# Patient Record
Sex: Female | Born: 1990 | Race: Black or African American | Hispanic: No | Marital: Single | Smoking: Never smoker
Health system: Southern US, Community
[De-identification: ages and names within clinical notes are randomized; demographics above are authoritative.]

## PROBLEM LIST (undated history)

## (undated) DIAGNOSIS — J45909 Unspecified asthma, uncomplicated: Secondary | ICD-10-CM

## (undated) HISTORY — PX: HERNIA REPAIR: SHX51

---
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
## 2009-06-09 ENCOUNTER — Emergency Department (HOSPITAL_COMMUNITY): Admission: EM | Admit: 2009-06-09 | Discharge: 2009-06-09 | Payer: Self-pay | Admitting: Family Medicine

## 2011-07-02 LAB — INFLUENZA A AND B ANTIGEN (CONVERTED LAB)
Inflenza A Ag: NEGATIVE
Influenza B Ag: NEGATIVE

## 2015-04-26 ENCOUNTER — Emergency Department (HOSPITAL_BASED_OUTPATIENT_CLINIC_OR_DEPARTMENT_OTHER)
Admission: EM | Admit: 2015-04-26 | Discharge: 2015-04-26 | Disposition: A | Discharge status: Home / Self Care | Payer: Self-pay | Attending: Emergency Medicine | Admitting: Emergency Medicine

## 2015-04-26 ENCOUNTER — Encounter (HOSPITAL_BASED_OUTPATIENT_CLINIC_OR_DEPARTMENT_OTHER): Payer: Self-pay

## 2015-04-26 ENCOUNTER — Emergency Department (HOSPITAL_BASED_OUTPATIENT_CLINIC_OR_DEPARTMENT_OTHER): Payer: Self-pay

## 2015-04-26 DIAGNOSIS — R200 Anesthesia of skin: Secondary | ICD-10-CM | POA: Insufficient documentation

## 2015-04-26 DIAGNOSIS — R079 Chest pain, unspecified: Secondary | ICD-10-CM

## 2015-04-26 DIAGNOSIS — R0789 Other chest pain: Secondary | ICD-10-CM | POA: Insufficient documentation

## 2015-04-26 DIAGNOSIS — D649 Anemia, unspecified: Secondary | ICD-10-CM | POA: Insufficient documentation

## 2015-04-26 DIAGNOSIS — Z880 Allergy status to penicillin: Secondary | ICD-10-CM | POA: Insufficient documentation

## 2015-04-26 DIAGNOSIS — R590 Localized enlarged lymph nodes: Secondary | ICD-10-CM | POA: Insufficient documentation

## 2015-04-26 DIAGNOSIS — J45909 Unspecified asthma, uncomplicated: Secondary | ICD-10-CM | POA: Insufficient documentation

## 2015-04-26 HISTORY — DX: Unspecified asthma, uncomplicated: J45.909

## 2015-04-26 LAB — BASIC METABOLIC PANEL
ANION GAP: 6 (ref 5–15)
BUN: 11 mg/dL (ref 6–20)
CHLORIDE: 102 mmol/L (ref 101–111)
CO2: 27 mmol/L (ref 22–32)
Calcium: 8.6 mg/dL — ABNORMAL LOW (ref 8.9–10.3)
Creatinine, Ser: 0.72 mg/dL (ref 0.44–1.00)
GFR calc Af Amer: >60 mL/min (ref >60)
GFR calc non Af Amer: >60 mL/min (ref >60)
Glucose, Bld: 101 mg/dL — ABNORMAL HIGH (ref 65–99)
POTASSIUM: 3.8 mmol/L (ref 3.5–5.1)
SODIUM: 135 mmol/L (ref 135–145)

## 2015-04-26 LAB — CBC WITH DIFFERENTIAL/PLATELET
BASOS ABS: 0 10*3/uL (ref 0.0–0.1)
Basophils Relative: 0 % (ref 0–1)
EOS ABS: 0.3 10*3/uL (ref 0.0–0.7)
EOS PCT: 2 % (ref 0–5)
HCT: 30.1 % — ABNORMAL LOW (ref 36.0–46.0)
Hemoglobin: 9.6 g/dL — ABNORMAL LOW (ref 12.0–15.0)
Lymphocytes Relative: 25 % (ref 12–46)
Lymphs Abs: 3.3 10*3/uL (ref 0.7–4.0)
MCH: 20.3 pg — ABNORMAL LOW (ref 26.0–34.0)
MCHC: 31.9 g/dL (ref 30.0–36.0)
MCV: 63.5 fL — AB (ref 78.0–100.0)
MONO ABS: 1.2 10*3/uL — AB (ref 0.1–1.0)
Monocytes Relative: 9 % (ref 3–12)
Neutro Abs: 8.3 10*3/uL — ABNORMAL HIGH (ref 1.7–7.7)
Neutrophils Relative %: 64 % (ref 43–77)
PLATELETS: 552 10*3/uL — AB (ref 150–400)
RBC: 4.74 MIL/uL (ref 3.87–5.11)
RDW: 19.1 % — AB (ref 11.5–15.5)
WBC: 13.1 10*3/uL — AB (ref 4.0–10.5)

## 2015-04-26 MED ORDER — FERROUS SULFATE 325 (65 FE) MG PO TABS: 325.0000 mg | ORAL_TABLET | Freq: Every day | ORAL | Status: AC

## 2015-04-26 NOTE — ED Notes (Signed)
Pt reports body aches x 5-6 weeks. Sts 3-4 days ago, she developed chest pain. Reports generalized chest pain. Also reports SHOB x 3-4 days

## 2015-04-26 NOTE — Discharge Instructions (Signed)
Anemia, Nonspecific °Anemia is a condition in which the concentration of red blood cells or hemoglobin in the blood is below normal. Hemoglobin is a substance in red blood cells that carries oxygen to the tissues of the body. Anemia results in not enough oxygen reaching these tissues.  °CAUSES  °Common causes of anemia include:  °· Excessive bleeding. Bleeding may be internal or external. This includes excessive bleeding from periods (in women) or from the intestine.   °· Poor nutrition.   °· Chronic kidney, thyroid, and liver disease.  °· Bone marrow disorders that decrease red blood cell production. °· Cancer and treatments for cancer. °· HIV, AIDS, and their treatments. °· Spleen problems that increase red blood cell destruction. °· Blood disorders. °· Excess destruction of red blood cells due to infection, medicines, and autoimmune disorders. °SIGNS AND SYMPTOMS  °· Minor weakness.   °· Dizziness.   °· Headache. °· Palpitations.   °· Shortness of breath, especially with exercise.   °· Paleness. °· Cold sensitivity. °· Indigestion. °· Nausea. °· Difficulty sleeping. °· Difficulty concentrating. °Symptoms may occur suddenly or they may develop slowly.  °DIAGNOSIS  °Additional blood tests are often needed. These help your health care provider determine the best treatment. Your health care provider will check your stool for blood and look for other causes of blood loss.  °TREATMENT  °Treatment varies depending on the cause of the anemia. Treatment can include:  °· Supplements of iron, vitamin B12, or folic acid.   °· Hormone medicines.   °· A blood transfusion. This may be needed if blood loss is severe.   °· Hospitalization. This may be needed if there is significant continual blood loss.   °· Dietary changes. °· Spleen removal. °HOME CARE INSTRUCTIONS °Keep all follow-up appointments. It often takes many weeks to correct anemia, and having your health care provider check on your condition and your response to  treatment is very important. °SEEK IMMEDIATE MEDICAL CARE IF:  °· You develop extreme weakness, shortness of breath, or chest pain.   °· You become dizzy or have trouble concentrating. °· You develop heavy vaginal bleeding.   °· You develop a rash.   °· You have bloody or black, tarry stools.   °· You faint.   °· You vomit up blood.   °· You vomit repeatedly.   °· You have abdominal pain. °· You have a fever or persistent symptoms for more than 2-3 days.   °· You have a fever and your symptoms suddenly get worse.   °· You are dehydrated.   °MAKE SURE YOU: °· Understand these instructions. °· Will watch your condition. °· Will get help right away if you are not doing well or get worse. °Document Released: 11/01/2004 Document Revised: 05/27/2013 Document Reviewed: 03/20/2013 °[employer]® Patient Information ©2015 [employer]. This information is not intended to replace advice given to you by your health care provider. Make sure you discuss any questions you have with your health care provider. ° °Chest Pain (Nonspecific) °It is often hard to give a specific diagnosis for the cause of chest pain. There is always a chance that your pain could be related to something serious, such as a heart attack or a blood clot in the lungs. You need to follow up with your health care provider for further evaluation. °CAUSES  °· Heartburn. °· Pneumonia or bronchitis. °· Anxiety or stress. °· Inflammation around your heart (pericarditis) or lung (pleuritis or pleurisy). °· A blood clot in the lung. °· A collapsed lung (pneumothorax). It can develop suddenly on its own (spontaneous pneumothorax) or from trauma to the chest. °·   Shingles infection (herpes zoster virus). °The chest wall is composed of bones, muscles, and cartilage. Any of these can be the source of the pain. °· The bones can be bruised by injury. °· The muscles or cartilage can be strained by coughing or overwork. °· The cartilage can be affected by inflammation and become  sore (costochondritis). °DIAGNOSIS  °Lab tests or other studies may be needed to find the cause of your pain. Your health care provider may have you take a test called an ambulatory electrocardiogram (ECG). An ECG records your heartbeat patterns over a 24-hour period. You may also have other tests, such as: °· Transthoracic echocardiogram (TTE). During echocardiography, sound waves are used to evaluate how blood flows through your heart. °· Transesophageal echocardiogram (TEE). °· Cardiac monitoring. This allows your health care provider to monitor your heart rate and rhythm in real time. °· Holter monitor. This is a portable device that records your heartbeat and can help diagnose heart arrhythmias. It allows your health care provider to track your heart activity for several days, if needed. °· Stress tests by exercise or by giving medicine that makes the heart beat faster. °TREATMENT  °· Treatment depends on what may be causing your chest pain. Treatment may include: °¨ Acid blockers for heartburn. °¨ Anti-inflammatory medicine. °¨ Pain medicine for inflammatory conditions. °¨ Antibiotics if an infection is present. °· You may be advised to change lifestyle habits. This includes stopping smoking and avoiding alcohol, caffeine, and chocolate. °· You may be advised to keep your head raised (elevated) when sleeping. This reduces the chance of acid going backward from your stomach into your esophagus. °Most of the time, nonspecific chest pain will improve within 2-3 days with rest and mild pain medicine.  °HOME CARE INSTRUCTIONS  °· If antibiotics were prescribed, take them as directed. Finish them even if you start to feel better. °· For the next few days, avoid physical activities that bring on chest pain. Continue physical activities as directed. °· Do not use any tobacco products, including cigarettes, chewing tobacco, or electronic cigarettes. °· Avoid drinking alcohol. °· Only take medicine as directed by your  health care provider. °· Follow your health care provider's suggestions for further testing if your chest pain does not go away. °· Keep any follow-up appointments you made. If you do not go to an appointment, you could develop lasting (chronic) problems with pain. If there is any problem keeping an appointment, call to reschedule. °SEEK MEDICAL CARE IF:  °· Your chest pain does not go away, even after treatment. °· You have a rash with blisters on your chest. °· You have a fever. °SEEK IMMEDIATE MEDICAL CARE IF:  °· You have increased chest pain or pain that spreads to your arm, neck, jaw, back, or abdomen. °· You have shortness of breath. °· You have an increasing cough, or you cough up blood. °· You have severe back or abdominal pain. °· You feel nauseous or vomit. °· You have severe weakness. °· You faint. °· You have chills. °This is an emergency. Do not wait to see if the pain will go away. Get medical help at once. Call your local emergency services (911 in [place] Do not drive yourself to the hospital. °MAKE SURE YOU:  °· Understand these instructions. °· Will watch your condition. °· Will get help right away if you are not doing well or get worse. °Document Released: 07/04/2005 Document Revised: 09/29/2013 Document Reviewed: 04/29/2008 °[employer]® Patient Information ©2015 [employer]. This   information is not intended to replace advice given to you by your health care provider. Make sure you discuss any questions you have with your health care provider. ° °

## 2015-04-26 NOTE — ED Notes (Signed)
Pt ambulating independently w/ steady gait on d/c in no acute distress, A&Ox4. D/c instructions reviewed w/ pt - pt denies any further questions or concerns at present. Rx given x1  

## 2015-04-26 NOTE — ED Provider Notes (Signed)
CSN: [id number]     Arrival date & time 04/26/15  1738 History  This chart was scribed for Chelsey CoreNathan Greycen Felter, MD by Chelsey Maxwell, ED Scribe. This patient was seen in room MH11/MH11 and patient care was started at 6:01 PM.    Chief Complaint  Patient presents with  . Chest Pain   The history is provided by the patient. No language interpreter was used.    HPI Comments: Chelsey Cheeselexandria Maxwell is a 24 y.o. female who presents to the Emergency Department complaining of constant, gradually worsening myalgias onset 6 weeks ago. States that she has had pain like this in the past but states that it usually goes away. Hurts most at morning and night. Reports associated generalized chest discomfort across her chest the past 3-4 days, with productive cough. States numbness from left shoulder radiating to arm as well. She denies sore throat, recent injury, heavy lifting, sick contacts or recent sickness. 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 Denies any chance that she could be pregnant or that she is a smoker.  Past Medical History  Diagnosis Date  . Asthma    Past Surgical History  Procedure Laterality Date  . Hernia repair     No family history on file. History  Substance Use Topics  . Smoking status: Never Smoker   . Smokeless tobacco: Not on file  . Alcohol Use: No   OB History    No data available     Review of Systems  HENT: Negative for sore throat.   Respiratory: Positive for cough.   Cardiovascular: Positive for chest pain.  Musculoskeletal: Positive for myalgias and arthralgias.  Neurological: Positive for numbness.  All other systems reviewed and are negative.  Allergies  Penicillins  Home Medications   Prior to Admission medications   Medication Sig Start Date End Date Taking? Authorizing Provider  ferrous sulfate 325 (65 FE) MG tablet Take 1 tablet (325 mg total) by mouth daily. 04/26/15   Chelsey CoreNathan Halil Rentz, MD   BP 103/72 mmHg   Pulse 102  Temp(Src) 98.4 F (36.9 C) (Oral)  Resp 22  Ht 5\' 6"  (1.676 m)  Wt 245 lb (111.131 kg)  BMI 39.56 kg/m2  SpO2 100%  LMP 04/17/2015  Physical Exam  Constitutional: She is oriented to person, place, and time. She appears well-developed and well-nourished. No distress.  HENT:  Head: Normocephalic and atraumatic.  Light anterior cervical adenopathy  Eyes: EOM are normal. Pupils are equal, round, and reactive to light.  Neck: Normal range of motion. Neck supple.  Cardiovascular: Normal rate and regular rhythm.   Pulmonary/Chest: Effort normal and breath sounds normal.  Anterior upper chest tenderness  Abdominal: Soft. There is no tenderness.  Musculoskeletal: Normal range of motion. She exhibits no tenderness.  No midline cervical tenderness, muscular tenderness  Neurological: She is alert and oriented to person, place, and time.  Sensation grossly intact over bilateral radial, medial and ulnar nerves but states that it is "delayed"  Skin: Skin is warm and dry.  Psychiatric: She has a normal mood and affect. Her behavior is normal.  Nursing note and vitals reviewed.   ED Course  Procedures (including critical care time) DIAGNOSTIC STUDIES: Oxygen Saturation is 100% on RA, normal by my interpretation.    COORDINATION OF CARE: 6:05 PM-Discussed treatment plan which includes chest x-ray and labs with pt at bedside and pt agreed to plan.   Labs Review Labs Reviewed  CBC WITH DIFFERENTIAL/PLATELET -  Abnormal; Notable for the following:    WBC 13.1 (*)    Hemoglobin 9.6 (*)    HCT 30.1 (*)    MCV 63.5 (*)    MCH 20.3 (*)    RDW 19.1 (*)    Platelets 552 (*)    Neutro Abs 8.3 (*)    Monocytes Absolute 1.2 (*)    All other components within normal limits  BASIC METABOLIC PANEL - Abnormal; Notable for the following:    Glucose, Bld 101 (*)    Calcium 8.6 (*)    All other components within normal limits    Imaging Review Dg Chest 2 View  04/26/2015   CLINICAL  DATA:  Upper chest pain/discomfort and LEFT arm numbness for 3 days, shortness of breath with exertion, cough, history asthma  EXAM: CHEST  2 VIEW  COMPARISON:  None  FINDINGS: Minimal enlargement of cardiac silhouette.  Mediastinal contours and pulmonary vascularity normal.  Lungs clear.  Minimal central peribronchial thickening.  No pleural effusion or pneumothorax.  Bones unremarkable.  IMPRESSION: Enlargement of cardiac silhouette.  Minimal bronchitic changes.   Electronically Signed   By: Ulyses Southward M.D.   On: 04/26/2015 18:50     EKG Interpretation   Date/Time:  Tuesday April 26 2015 17:55:18 EDT Ventricular Rate:  86 PR Interval:  160 QRS Duration: 88 QT Interval:  376 QTC Calculation: 449 R Axis:   73 Text Interpretation:  Normal sinus rhythm Normal ECG Confirmed by  Simya Tercero  MD, Harrold Donath [phone]) on 04/26/2015 6:01:04 PM      MDM   Final diagnoses:  Chest pain, unspecified chest pain type  Anemia, unspecified anemia type    Patient with chest pain and myalgias. No clear cause. Doubt ischemic cause. Lab work overall reassuring but does show anemia. Will follow-up with her PCP. She also has elevated white count and elevated platelets. I personally performed the services described in this documentation, which was scribed in my presence. The recorded information has been reviewed and is accurate.     Chelsey Core, MD 04/27/15 2124

## 2016-11-27 IMAGING — DX DG CHEST 2V
2 series · 2 of 2 positions shown · non-contrast
Comparison: None

CLINICAL DATA: Upper chest pain/discomfort and LEFT arm numbness
for 3 days, shortness of breath with exertion, cough, history asthma

EXAM:
CHEST  2 VIEW

[chest pa]
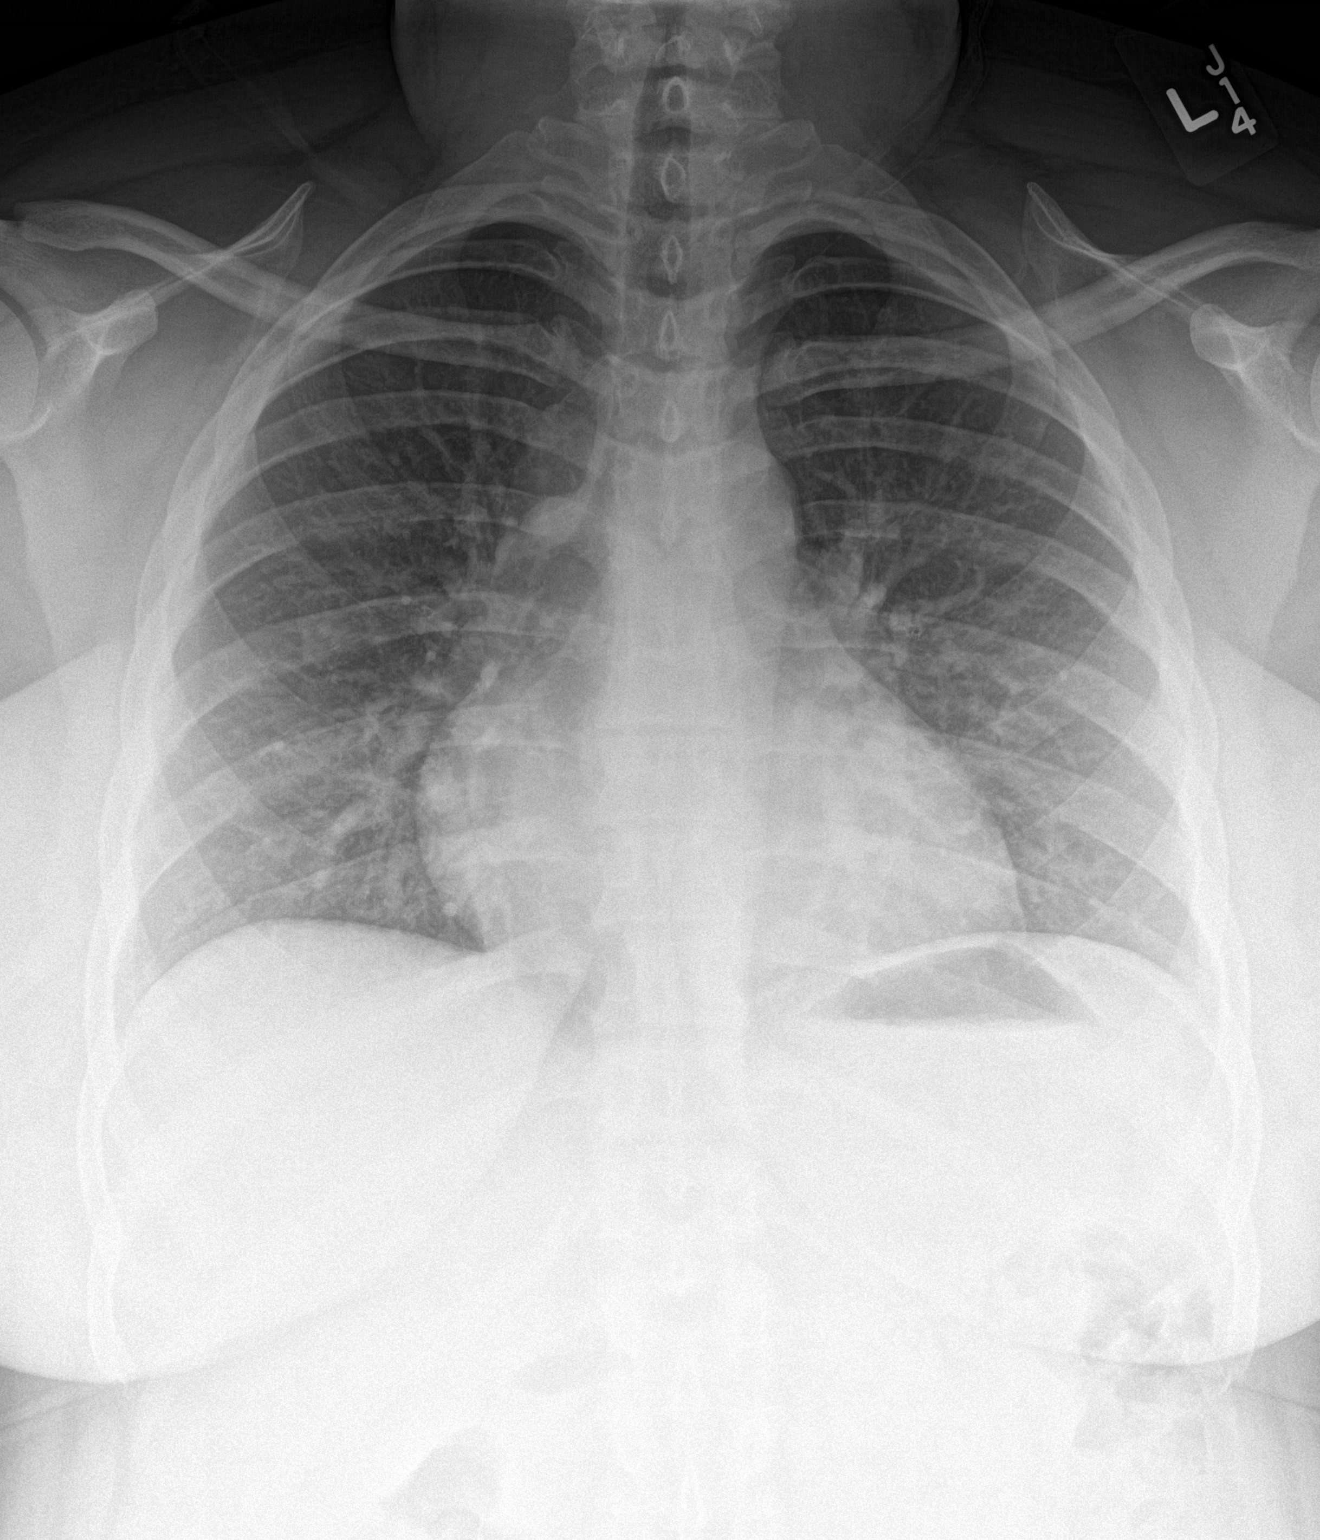

[chest lat]
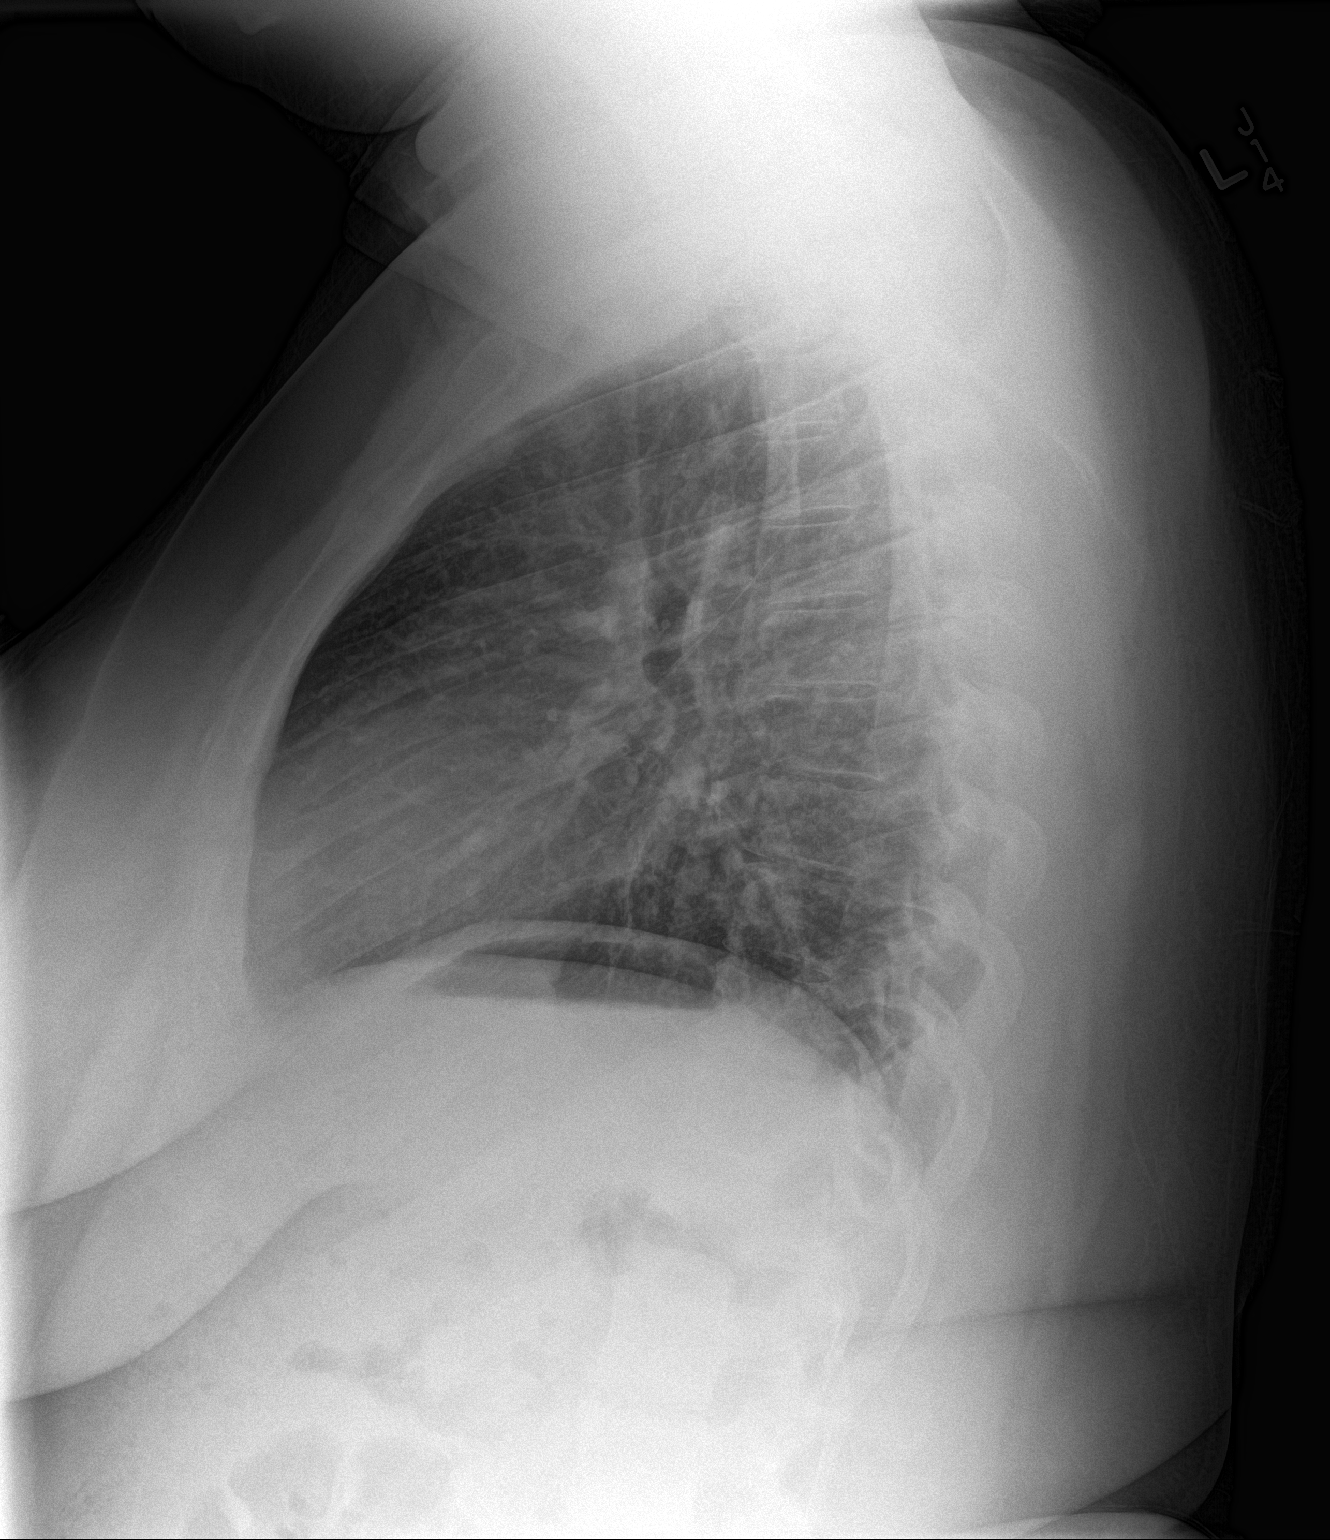

[2 of 2 positions shown; findings below may reference images not displayed]

FINDINGS: Minimal enlargement of cardiac silhouette.

Mediastinal contours and pulmonary vascularity normal.

Lungs clear.

Minimal central peribronchial thickening.

No pleural effusion or pneumothorax.

Bones unremarkable.
IMPRESSION: Enlargement of cardiac silhouette.

Minimal bronchitic changes.
